# Patient Record
Sex: Female | Born: 1965 | Race: Black or African American | Hispanic: No | Marital: Single | Smoking: Never smoker
Health system: Southern US, Community
[De-identification: ages and names within clinical notes are randomized; demographics above are authoritative.]

## PROBLEM LIST (undated history)

## (undated) DIAGNOSIS — E119 Type 2 diabetes mellitus without complications: Secondary | ICD-10-CM

## (undated) DIAGNOSIS — R29898 Other symptoms and signs involving the musculoskeletal system: Secondary | ICD-10-CM

## (undated) DIAGNOSIS — E559 Vitamin D deficiency, unspecified: Secondary | ICD-10-CM

## (undated) DIAGNOSIS — E785 Hyperlipidemia, unspecified: Secondary | ICD-10-CM

## (undated) DIAGNOSIS — N809 Endometriosis, unspecified: Secondary | ICD-10-CM

## (undated) HISTORY — DX: Endometriosis, unspecified: N80.9

## (undated) HISTORY — DX: Other symptoms and signs involving the musculoskeletal system: R29.898

## (undated) HISTORY — PX: LAPAROSCOPY: SHX197

## (undated) HISTORY — DX: Type 2 diabetes mellitus without complications: E11.9

## (undated) HISTORY — DX: Hyperlipidemia, unspecified: E78.5

## (undated) HISTORY — PX: OTHER SURGICAL HISTORY: SHX169

## (undated) HISTORY — DX: Vitamin D deficiency, unspecified: E55.9

## (undated) HISTORY — PX: WISDOM TOOTH EXTRACTION: SHX21

---

## 2001-12-18 ENCOUNTER — Other Ambulatory Visit: Admission: RE | Admit: 2001-12-18 | Discharge: 2001-12-18 | Payer: Self-pay | Admitting: *Deleted

## 2004-03-05 ENCOUNTER — Other Ambulatory Visit: Admission: RE | Admit: 2004-03-05 | Discharge: 2004-03-05 | Payer: Self-pay | Admitting: Family Medicine

## 2005-03-07 ENCOUNTER — Other Ambulatory Visit: Admission: RE | Admit: 2005-03-07 | Discharge: 2005-03-07 | Payer: Self-pay | Admitting: Family Medicine

## 2008-05-02 ENCOUNTER — Other Ambulatory Visit: Admission: RE | Admit: 2008-05-02 | Discharge: 2008-05-02 | Payer: Self-pay | Admitting: Family Medicine

## 2009-05-04 ENCOUNTER — Other Ambulatory Visit: Admission: RE | Admit: 2009-05-04 | Discharge: 2009-05-04 | Payer: Self-pay | Admitting: Family Medicine

## 2010-05-13 ENCOUNTER — Other Ambulatory Visit: Admission: RE | Admit: 2010-05-13 | Discharge: 2010-05-13 | Payer: Self-pay | Admitting: Family Medicine

## 2013-06-05 ENCOUNTER — Other Ambulatory Visit: Payer: Self-pay | Admitting: Family Medicine

## 2013-06-05 ENCOUNTER — Other Ambulatory Visit (HOSPITAL_COMMUNITY)
Admission: RE | Admit: 2013-06-05 | Discharge: 2013-06-05 | Disposition: A | Discharge status: Home / Self Care | Payer: BC Managed Care – PPO | Source: Ambulatory Visit | Attending: Family Medicine | Admitting: Family Medicine

## 2013-06-05 DIAGNOSIS — Z124 Encounter for screening for malignant neoplasm of cervix: Secondary | ICD-10-CM | POA: Insufficient documentation

## 2013-06-05 DIAGNOSIS — Z1151 Encounter for screening for human papillomavirus (HPV): Secondary | ICD-10-CM | POA: Insufficient documentation

## 2014-06-11 ENCOUNTER — Other Ambulatory Visit: Payer: Self-pay | Admitting: Family Medicine

## 2014-06-11 ENCOUNTER — Ambulatory Visit
Admission: RE | Admit: 2014-06-11 | Discharge: 2014-06-11 | Disposition: A | Discharge status: Home / Self Care | Payer: BC Managed Care – PPO | Source: Ambulatory Visit | Attending: Family Medicine | Admitting: Family Medicine

## 2014-06-11 DIAGNOSIS — M25531 Pain in right wrist: Secondary | ICD-10-CM

## 2018-08-23 ENCOUNTER — Other Ambulatory Visit (HOSPITAL_COMMUNITY)
Admission: RE | Admit: 2018-08-23 | Discharge: 2018-08-23 | Disposition: A | Discharge status: Home / Self Care | Payer: BC Managed Care – PPO | Source: Ambulatory Visit | Attending: Family Medicine | Admitting: Family Medicine

## 2018-08-23 ENCOUNTER — Other Ambulatory Visit: Payer: Self-pay | Admitting: Family Medicine

## 2018-08-23 DIAGNOSIS — Z124 Encounter for screening for malignant neoplasm of cervix: Secondary | ICD-10-CM | POA: Insufficient documentation

## 2018-08-24 LAB — CYTOLOGY - PAP
ADEQUACY: ABSENT
DIAGNOSIS: NEGATIVE
HPV: NOT DETECTED

## 2019-01-17 ENCOUNTER — Other Ambulatory Visit: Payer: Self-pay | Admitting: Family Medicine

## 2019-01-17 DIAGNOSIS — N644 Mastodynia: Secondary | ICD-10-CM

## 2019-01-22 ENCOUNTER — Other Ambulatory Visit: Payer: BC Managed Care – PPO

## 2019-01-29 ENCOUNTER — Other Ambulatory Visit: Payer: BC Managed Care – PPO

## 2019-01-31 ENCOUNTER — Telehealth: Payer: Self-pay | Admitting: *Deleted

## 2019-01-31 ENCOUNTER — Encounter: Payer: Self-pay | Admitting: *Deleted

## 2019-01-31 NOTE — Telephone Encounter (Signed)
The patient has a virtual visit with Dr. Terrace Arabia on 02/07/2019.  I left her a message requesting a return call to update her chart prior to this appt.  For now, the paper referral information from her PCP has been added to her Epic chart.  When she calls back, we will verify the information is correct.  We will also need to ask which pharmacy she uses.

## 2019-02-05 ENCOUNTER — Ambulatory Visit (INDEPENDENT_AMBULATORY_CARE_PROVIDER_SITE_OTHER): Payer: BC Managed Care – PPO | Admitting: Neurology

## 2019-02-05 ENCOUNTER — Encounter: Payer: Self-pay | Admitting: Neurology

## 2019-02-05 ENCOUNTER — Other Ambulatory Visit: Payer: Self-pay

## 2019-02-05 DIAGNOSIS — R29898 Other symptoms and signs involving the musculoskeletal system: Secondary | ICD-10-CM | POA: Diagnosis not present

## 2019-02-05 NOTE — Progress Notes (Signed)
PATIENT: Cassidy Vang DOB: 1966/04/13  Virtual Visit via video  I connected with Cassidy Vang on 02/05/19 at  by video and verified that I am speaking with the correct person using two identifiers.   I discussed the limitations, risks, security and privacy concerns of performing an evaluation and management service by video and the availability of in person appointments. I also discussed with the patient that there may be a patient responsible charge related to this service. The patient expressed understanding and agreed to proceed.  HISTORICAL  Cassidy Vang is a 53 year old female, seen in request by her primary care PA Jarrett SohoWharton, [place] for evaluation of right arm weakness.  Initial visit is to virtual visit on Feb 05, 2019.  I have reviewed and summarized the referring note from the referring physician.  She had a past medical history of hypertension, hyperlipidemia, recent diagnosis of diabetes since March 2020  She works as a Psychologist, educationaltrainer for school bus driver, in March 2020, she began to experience intermittent right arm, hand weakness, right wrist discomfort, she also has intermittent right finger paresthesia, involving all 5 fingers  She described difficulty opening jar of pickles, needing help from her left hand to pick up a gallon of milk, she denies significant neck pain, she denies left arm paresthesia or weakness, she denies gait abnormality, she has no bowel and bladder incontinence.   Observations/Objective: I have reviewed problem lists, medications, allergies.  Awake alert oriented to history taking care of conversation, facial were symmetric, moving 4 extremities without difficulty, bilateral hand looks symmetric, no intrinsic hand muscle atrophy noted,  Assessment and Plan: Right hand intermittent weakness and paresthesia, right wrist discomfort  Differentiation diagnosis including right carpal tunnel syndrome,  Proceed with EMG nerve conduction study  Follow Up  Instructions:  EMG nerve conduction study in 3 to 4 weeks     I discussed the assessment and treatment plan with the patient. The patient was provided an opportunity to ask questions and all were answered. The patient agreed with the plan and demonstrated an understanding of the instructions.   The patient was advised to call back or seek an in-person evaluation if the symptoms worsen or if the condition fails to improve as anticipated.  I provided 30 minutes minutes of non-face-to-face time during this encounter.  REVIEW OF SYSTEMS: Full 14 system review of systems performed and notable only for as above All other review of systems were negative.  ALLERGIES: No Known Allergies  HOME MEDICATIONS: Current Outpatient Medications  Medication Sig Dispense Refill  . atorvastatin (LIPITOR) 20 MG tablet Take 20 mg by mouth daily.    . B-D UF III MINI PEN NEEDLES 31G X 5 MM MISC USE WITH VICTOZA    . lisinopril (ZESTRIL) 2.5 MG tablet Take 2.5 mg by mouth daily.    . metFORMIN (GLUCOPHAGE-XR) 500 MG 24 hr tablet Take 1 tablet by mouth daily.    Marland Kitchen. VICTOZA 18 MG/3ML SOPN START 0.6 MG AND INCREASE TO 1.2MG  AFTER 1 WEEK ONCE DAILY SUBCUTANEOUS 90 DAYS     No current facility-administered medications for this visit.     PAST MEDICAL HISTORY: Past Medical History:  Diagnosis Date  . Diabetes (HCC)   . Dyslipidemia   . Endometriosis   . Right arm weakness   . Vitamin D deficiency     PAST SURGICAL HISTORY: Past Surgical History:  Procedure Laterality Date  . LAPAROSCOPY     Endometriosis  . right breast biopsy    .  WISDOM TOOTH EXTRACTION      FAMILY HISTORY: Family History  Problem Relation Age of Onset  . Stroke Mother   . Breast cancer Mother   . Diabetes Mother   . Hypertension Mother   . Stroke Maternal Grandmother   . Lung cancer Maternal Uncle   . Breast cancer Cousin     SOCIAL HISTORY:   Social History   Socioeconomic History  . Marital status: Single     Spouse name: Not on file  . Number of children: 3  . Years of education: college  . Highest education level: Not on file  Occupational History  . Occupation: Museum/gallery exhibitions officer school bus drivers  Social Needs  . Financial resource strain: Not on file  . Food insecurity:    Worry: Not on file    Inability: Not on file  . Transportation needs:    Medical: Not on file    Non-medical: Not on file  Tobacco Use  . Smoking status: Never Smoker  . Smokeless tobacco: Never Used  Substance and Sexual Activity  . Alcohol use: Not Currently  . Drug use: Never  . Sexual activity: Not on file  Lifestyle  . Physical activity:    Days per week: Not on file    Minutes per session: Not on file  . Stress: Not on file  Relationships  . Social connections:    Talks on phone: Not on file    Gets together: Not on file    Attends religious service: Not on file    Active member of club or organization: Not on file    Attends meetings of clubs or organizations: Not on file    Relationship status: Not on file  . Intimate partner violence:    Fear of current or ex partner: Not on file    Emotionally abused: Not on file    Physically abused: Not on file    Forced sexual activity: Not on file  Other Topics Concern  . Not on file  Social History Narrative   She has three adopted sons.   Drinks sweet tea.    Levert Feinstein, M.D. Ph.D.  [facility] [address], [address] [place], [place] [address] Ph: [phone] Fax: [phone]  CC: Jarrett Soho, PA-C

## 2019-02-12 ENCOUNTER — Other Ambulatory Visit: Payer: Self-pay

## 2019-02-12 ENCOUNTER — Ambulatory Visit: Payer: BC Managed Care – PPO

## 2019-02-12 ENCOUNTER — Ambulatory Visit
Admission: RE | Admit: 2019-02-12 | Discharge: 2019-02-12 | Disposition: A | Discharge status: Home / Self Care | Payer: BC Managed Care – PPO | Source: Ambulatory Visit | Attending: Family Medicine | Admitting: Family Medicine

## 2019-02-12 DIAGNOSIS — N644 Mastodynia: Secondary | ICD-10-CM

## 2019-08-28 ENCOUNTER — Other Ambulatory Visit: Payer: Self-pay | Admitting: Family Medicine

## 2019-08-28 ENCOUNTER — Other Ambulatory Visit (HOSPITAL_COMMUNITY)
Admission: RE | Admit: 2019-08-28 | Discharge: 2019-08-28 | Disposition: A | Discharge status: Home / Self Care | Payer: BC Managed Care – PPO | Source: Ambulatory Visit | Attending: Family Medicine | Admitting: Family Medicine

## 2019-08-28 DIAGNOSIS — Z124 Encounter for screening for malignant neoplasm of cervix: Secondary | ICD-10-CM | POA: Diagnosis present

## 2019-08-30 LAB — CYTOLOGY - PAP: Diagnosis: NEGATIVE

## 2020-04-15 ENCOUNTER — Other Ambulatory Visit: Payer: Self-pay | Admitting: Obstetrics and Gynecology

## 2020-06-07 IMAGING — MG DIGITAL DIAGNOSTIC BILATERAL MAMMOGRAM WITH TOMO AND CAD
8 of 12 series · 8 of 28 positions shown · non-contrast
Comparison: Previous exams in 4562 and 5629 from [REDACTED]
Health.

CLINICAL DATA: 52-year-old presenting with intermittent diffuse
LEFT breast pain over the past 6 months. Annual evaluation, RIGHT
breast.

Family history of breast cancer in her mother. Personal history of
central duct excision from the RIGHT breast approximately 20 years
ago.
EXAM:
DIGITAL DIAGNOSTIC BILATERAL MAMMOGRAM WITH CAD AND TOMO

[L ML (1 of 2)]
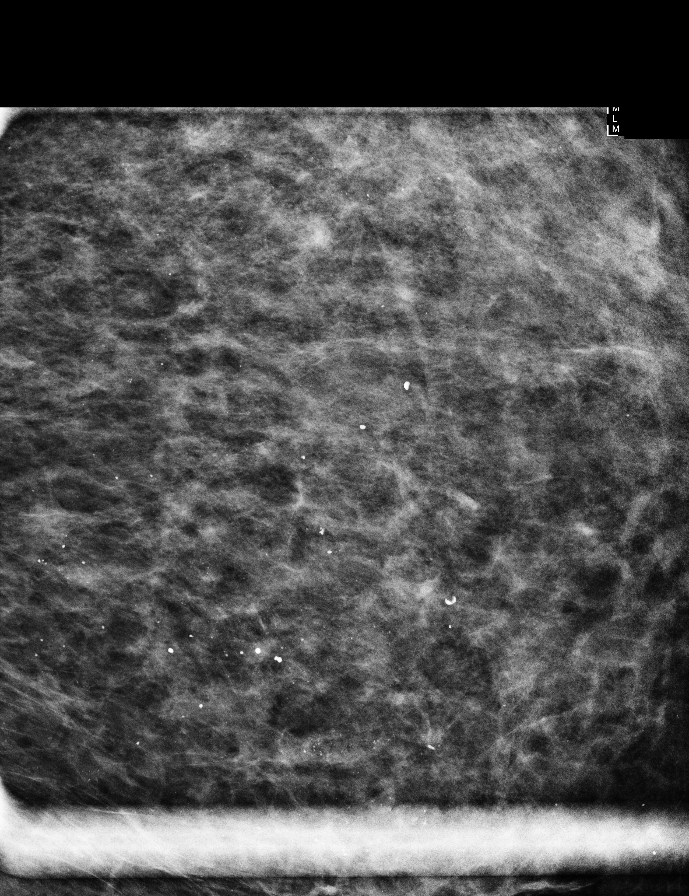

[L CC (1 of 2)]
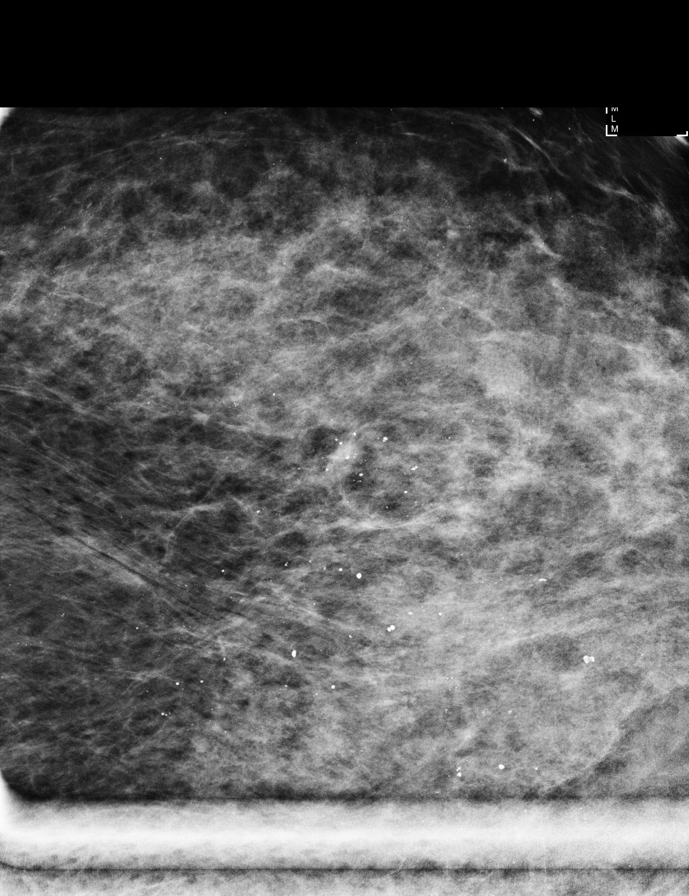

[L CC (2 of 2)]
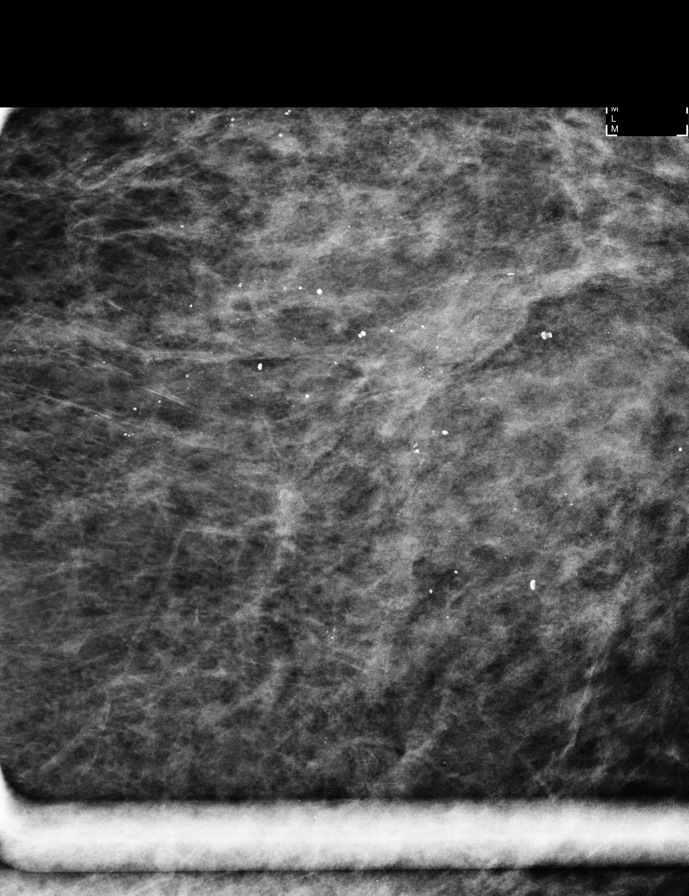

[L ML (2 of 2)]
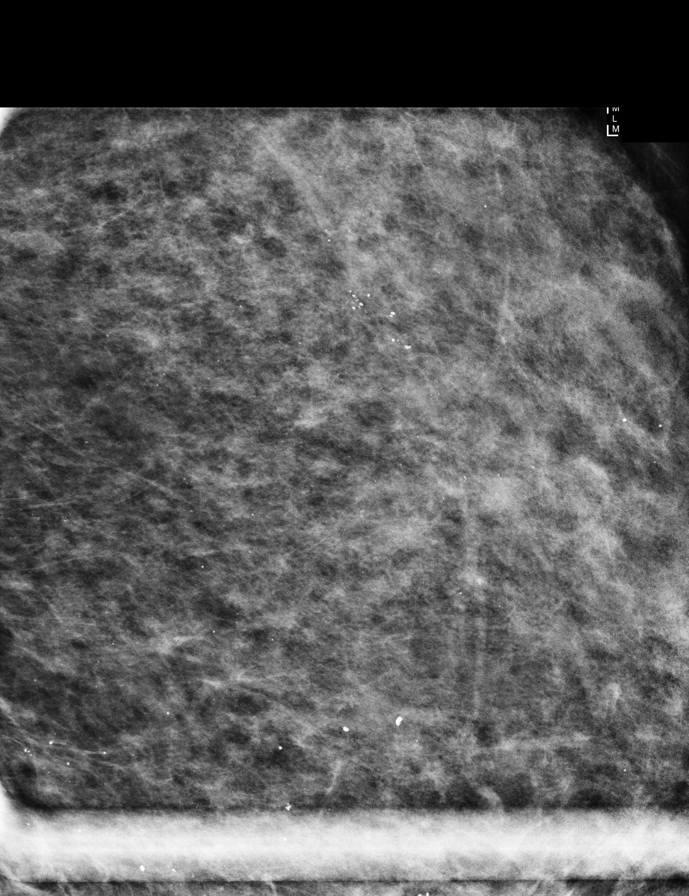

[R MLO synth-2D]
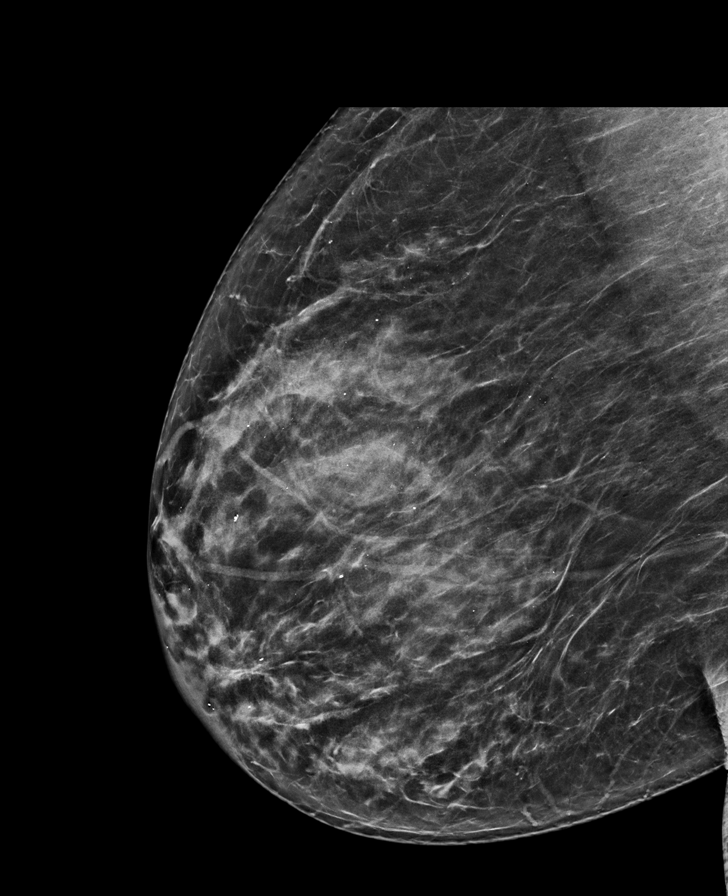

[R CC synth-2D]
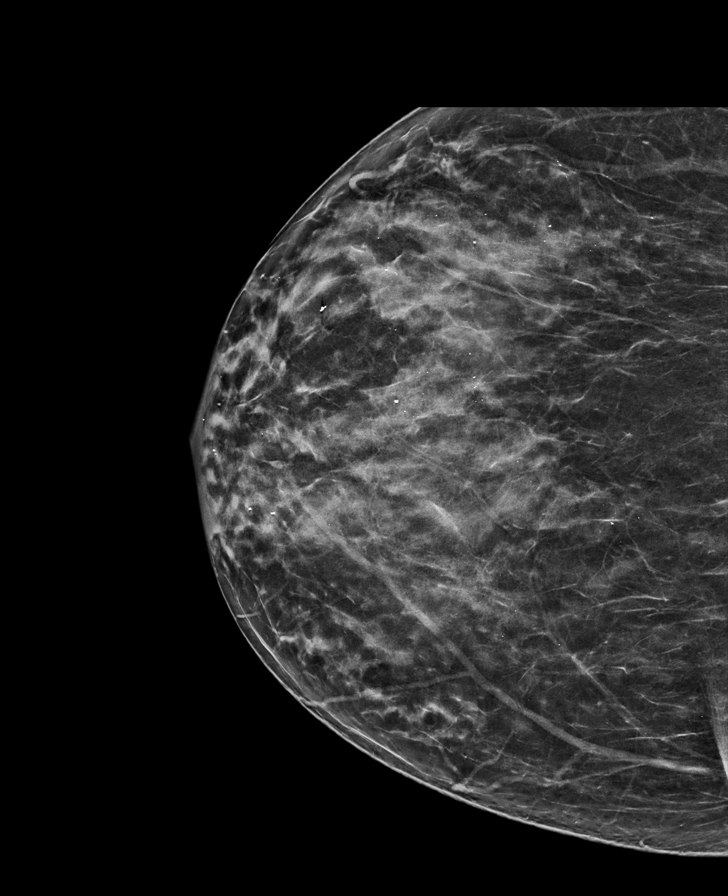

[L MLO synth-2D]
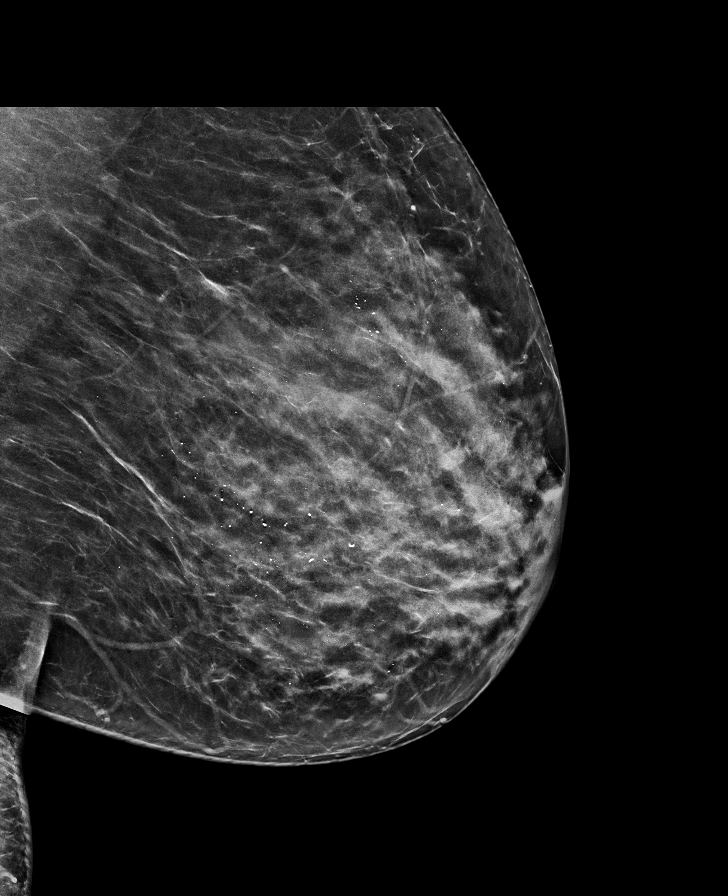

[L CC synth-2D]
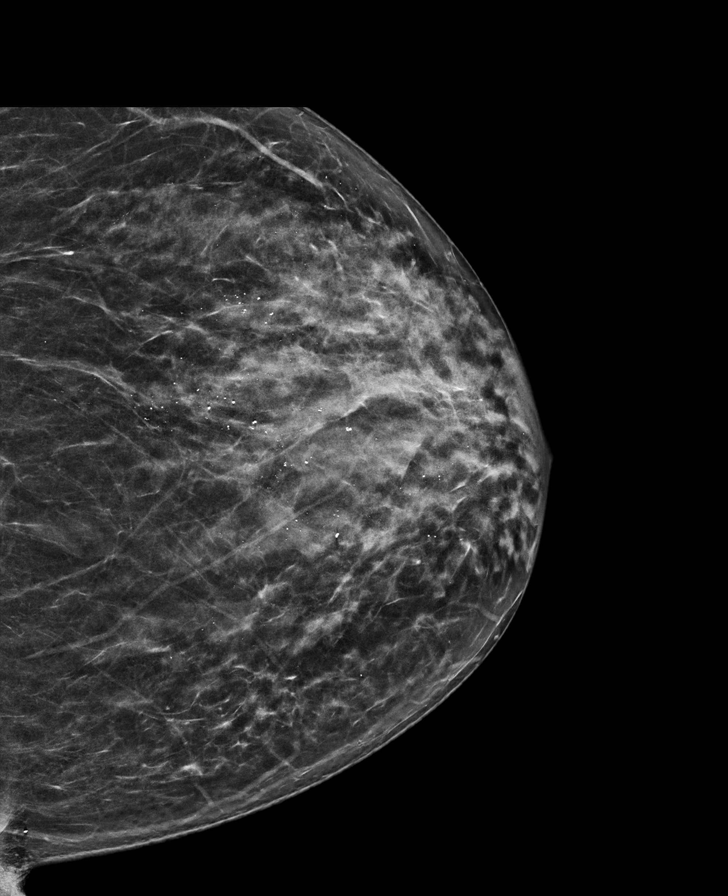

[8 of 28 positions shown; findings below may reference images not displayed]

ACR Breast Density Category c: The breast tissue is heterogeneously
dense, which may obscure small masses.
FINDINGS: Tomosynthesis and synthesized full field CC and MLO views of both
breasts were obtained. Standard spot magnification CC and
mediolateral views of LEFT breast calcifications were also obtained.

Diffuse round and punctate calcifications are present throughout
both breasts, LEFT greater than RIGHT. There are no suspicious
linear or branching forms, confirmed on the spot magnification
images.

No findings suspicious for malignancy in either breast.

Mammographic images were processed with CAD.
IMPRESSION: No mammographic evidence of malignancy involving either breast.

RECOMMENDATION:
Screening mammogram in one year.(Code:[id number])

Strategies for alleviating breast pain including decreasing caffeine
intake, vitamin-E supplementation, and avoidance of tight
compression brassieres, including underwire brassieres, were
discussed with the patient.

I have discussed the findings and recommendations with the patient.
If applicable, a reminder letter will be sent to the patient
regarding the next appointment.

BI-RADS CATEGORY  2: Benign.

## 2020-06-12 ENCOUNTER — Other Ambulatory Visit: Payer: Self-pay | Admitting: Obstetrics and Gynecology

## 2021-04-07 ENCOUNTER — Encounter: Payer: Self-pay | Admitting: Internal Medicine
